# Patient Record
Sex: Female | Born: 1955 | Race: White | Hispanic: No | Smoking: Never smoker
Health system: Southern US, Community
[De-identification: ages and names within clinical notes are randomized; demographics above are authoritative.]

## PROBLEM LIST (undated history)

## (undated) DIAGNOSIS — R011 Cardiac murmur, unspecified: Secondary | ICD-10-CM

## (undated) DIAGNOSIS — E559 Vitamin D deficiency, unspecified: Secondary | ICD-10-CM

## (undated) DIAGNOSIS — E785 Hyperlipidemia, unspecified: Secondary | ICD-10-CM

## (undated) DIAGNOSIS — F32A Depression, unspecified: Secondary | ICD-10-CM

## (undated) DIAGNOSIS — R519 Headache, unspecified: Secondary | ICD-10-CM

## (undated) DIAGNOSIS — K589 Irritable bowel syndrome without diarrhea: Secondary | ICD-10-CM

## (undated) DIAGNOSIS — R05 Cough: Secondary | ICD-10-CM

## (undated) DIAGNOSIS — E039 Hypothyroidism, unspecified: Secondary | ICD-10-CM

## (undated) DIAGNOSIS — R51 Headache: Secondary | ICD-10-CM

## (undated) DIAGNOSIS — E079 Disorder of thyroid, unspecified: Secondary | ICD-10-CM

## (undated) DIAGNOSIS — Z8489 Family history of other specified conditions: Secondary | ICD-10-CM

## (undated) DIAGNOSIS — M810 Age-related osteoporosis without current pathological fracture: Secondary | ICD-10-CM

## (undated) DIAGNOSIS — F988 Other specified behavioral and emotional disorders with onset usually occurring in childhood and adolescence: Secondary | ICD-10-CM

## (undated) DIAGNOSIS — M1990 Unspecified osteoarthritis, unspecified site: Secondary | ICD-10-CM

## (undated) DIAGNOSIS — K219 Gastro-esophageal reflux disease without esophagitis: Secondary | ICD-10-CM

## (undated) DIAGNOSIS — M797 Fibromyalgia: Secondary | ICD-10-CM

## (undated) DIAGNOSIS — F419 Anxiety disorder, unspecified: Secondary | ICD-10-CM

## (undated) DIAGNOSIS — R053 Chronic cough: Secondary | ICD-10-CM

## (undated) DIAGNOSIS — F329 Major depressive disorder, single episode, unspecified: Secondary | ICD-10-CM

## (undated) HISTORY — DX: Disorder of thyroid, unspecified: E07.9

## (undated) HISTORY — DX: Major depressive disorder, single episode, unspecified: F32.9

## (undated) HISTORY — PX: FOOT SURGERY: SHX648

## (undated) HISTORY — PX: FACELIFT: SHX1566

## (undated) HISTORY — DX: Hyperlipidemia, unspecified: E78.5

## (undated) HISTORY — PX: OTHER SURGICAL HISTORY: SHX169

## (undated) HISTORY — DX: Vitamin D deficiency, unspecified: E55.9

## (undated) HISTORY — DX: Depression, unspecified: F32.A

## (undated) HISTORY — DX: Anxiety disorder, unspecified: F41.9

## (undated) HISTORY — DX: Fibromyalgia: M79.7

## (undated) HISTORY — DX: Other specified behavioral and emotional disorders with onset usually occurring in childhood and adolescence: F98.8

---

## 1999-10-21 ENCOUNTER — Encounter: Payer: Self-pay | Admitting: Internal Medicine

## 1999-10-21 ENCOUNTER — Encounter: Admission: RE | Admit: 1999-10-21 | Discharge: 1999-10-21 | Payer: Self-pay | Admitting: Internal Medicine

## 2001-02-02 ENCOUNTER — Encounter: Admission: RE | Admit: 2001-02-02 | Discharge: 2001-02-02 | Payer: Self-pay | Admitting: Internal Medicine

## 2001-02-02 ENCOUNTER — Encounter: Payer: Self-pay | Admitting: Internal Medicine

## 2002-03-01 ENCOUNTER — Encounter: Admission: RE | Admit: 2002-03-01 | Discharge: 2002-03-01 | Payer: Self-pay | Admitting: Internal Medicine

## 2002-03-01 ENCOUNTER — Encounter: Payer: Self-pay | Admitting: Internal Medicine

## 2002-03-07 ENCOUNTER — Encounter: Admission: RE | Admit: 2002-03-07 | Discharge: 2002-03-07 | Payer: Self-pay | Admitting: Obstetrics and Gynecology

## 2002-03-07 ENCOUNTER — Encounter: Payer: Self-pay | Admitting: Obstetrics and Gynecology

## 2002-03-14 ENCOUNTER — Other Ambulatory Visit: Admission: RE | Admit: 2002-03-14 | Discharge: 2002-03-14 | Payer: Self-pay | Admitting: Obstetrics and Gynecology

## 2003-06-25 ENCOUNTER — Ambulatory Visit (HOSPITAL_BASED_OUTPATIENT_CLINIC_OR_DEPARTMENT_OTHER): Admission: RE | Admit: 2003-06-25 | Discharge: 2003-06-26 | Payer: Self-pay | Admitting: Specialist

## 2003-06-25 ENCOUNTER — Ambulatory Visit (HOSPITAL_BASED_OUTPATIENT_CLINIC_OR_DEPARTMENT_OTHER): Admission: RE | Admit: 2003-06-25 | Discharge: 2003-06-25 | Payer: Self-pay | Admitting: Podiatry

## 2003-09-05 ENCOUNTER — Other Ambulatory Visit: Admission: RE | Admit: 2003-09-05 | Discharge: 2003-09-05 | Payer: Self-pay | Admitting: Obstetrics and Gynecology

## 2003-09-13 ENCOUNTER — Encounter: Admission: RE | Admit: 2003-09-13 | Discharge: 2003-09-13 | Payer: Self-pay | Admitting: Obstetrics and Gynecology

## 2003-10-17 ENCOUNTER — Inpatient Hospital Stay (HOSPITAL_COMMUNITY): Admission: RE | Admit: 2003-10-17 | Discharge: 2003-10-19 | Payer: Self-pay | Admitting: Psychiatry

## 2003-10-23 ENCOUNTER — Other Ambulatory Visit (HOSPITAL_COMMUNITY): Admission: RE | Admit: 2003-10-23 | Discharge: 2003-10-24 | Payer: Self-pay | Admitting: Psychiatry

## 2004-10-29 ENCOUNTER — Encounter: Admission: RE | Admit: 2004-10-29 | Discharge: 2004-10-29 | Payer: Self-pay | Admitting: Obstetrics and Gynecology

## 2006-11-09 ENCOUNTER — Encounter: Admission: RE | Admit: 2006-11-09 | Discharge: 2006-11-09 | Payer: Self-pay | Admitting: Obstetrics and Gynecology

## 2007-10-13 ENCOUNTER — Encounter: Admission: RE | Admit: 2007-10-13 | Discharge: 2007-10-13 | Payer: Self-pay | Admitting: Family Medicine

## 2008-01-30 ENCOUNTER — Encounter: Admission: RE | Admit: 2008-01-30 | Discharge: 2008-01-30 | Payer: Self-pay | Admitting: Obstetrics and Gynecology

## 2009-02-22 ENCOUNTER — Encounter: Admission: RE | Admit: 2009-02-22 | Discharge: 2009-02-22 | Payer: Self-pay | Admitting: Obstetrics and Gynecology

## 2010-04-09 ENCOUNTER — Encounter: Admission: RE | Admit: 2010-04-09 | Discharge: 2010-04-09 | Payer: Self-pay | Admitting: Obstetrics and Gynecology

## 2010-06-22 ENCOUNTER — Encounter: Payer: Self-pay | Admitting: Family Medicine

## 2010-06-22 ENCOUNTER — Encounter: Payer: Self-pay | Admitting: Obstetrics and Gynecology

## 2010-10-17 NOTE — Discharge Summary (Signed)
Tracey Rowe, Tracey Rowe                         ACCOUNT NO.:  [id number]   MEDICAL RECORD NO.:  [id number]                   PATIENT TYPE:  IPS   LOCATION:  0502                                 FACILITY:  [facility]   PHYSICIAN:  Geoffery Lyons, M.D.                   DATE OF BIRTH:  March 14, 1956   DATE OF ADMISSION:  10/17/2003  DATE OF DISCHARGE:  10/19/2003                                 DISCHARGE SUMMARY   CHIEF COMPLAINT AND PRESENT ILLNESS:  This was the first admission to [facility] for this 55 year old married white female.  History  of depression, suicidal threats, claimed that the husband was having an  affair but that is apparently over and she was trying to deal with it but  cannot resolve it.  They are both in therapy.  She threatened to cut herself  and the husband told the family.  She ran away.  Family wanted her to seek  help.   PAST PSYCHIATRIC HISTORY:  First time at [facility].  Had seen Dr.  Evelene Croon and Joette Catching. Salinger, Ph.D.   ALCOHOL/DRUG HISTORY:  Denies the use of alcohol.  Admits to occasional use  of marijuana.   MEDICAL HISTORY:  Fibromyalgia.   MEDICATIONS:  Neurontin 800 mg, 2 four times a day, Prozac 40 mg per day,  Soma 350 mg, 1-2 four times a day as needed, Xanax 0.5 mg three times a day,  Wellbutrin XL 300 mg daily.   PHYSICAL EXAMINATION:  Performed and failed to show any acute findings.   LABORATORY DATA:  CBC within normal limits.  Blood chemistry within normal  limits.  Thyroid profile within normal limits.  Urine pregnancy negative.  Drug screen positive for amphetamines and benzodiazepines.   MENTAL STATUS EXAM:  Alert, middle-aged female.  Cooperative.  Good eye  contact.  Speech clear, goal-oriented, articulate.  Mood depressed.  Affect  constricted.  Thought processes coherent and relevant.  No delusional ideas.  No hallucinations.  Cognition well-preserved.   ADMISSION DIAGNOSES:   AXIS I:  Major depression,  single episode.   AXIS II:  No diagnosis.   AXIS III:  Fibromyalgia.   AXIS IV:  Moderate.   AXIS V:  Global Assessment of Functioning upon admission 35; highest Global  Assessment of Functioning in the last year 65.   HOSPITAL COURSE:  She was admitted and started intensive individual and  group psychotherapy.  She was maintained on Neurontin 800 mg 2 four times a  day, Wellbutrin XL 300 mg per day, amphetamine 20 mg in the morning, Xanax  0.5 mg three times a day as needed, Soma 350 mg, 1-2 four times a day as  needed, Prozac 40 mg per day.  Neurontin was changed to 800 mg four times a  day.  She was given some Ultram 50 mg for pain.  We requested family session  with the  husband.  She endorsed increased anxiety and depression, stemming  from having found out that the husband had the affair.  Also dealing with  the first flare-ups of fibromyalgia.  She was denying any suicidal ideation.  She was wanting to go back and see Dr. Evelene Croon and maybe get involved in  intensive outpatient program.  Family session with the husband.  They were  able to problem-solve.  She was upbeat.  Denied any suicidal or homicidal  ideation.  The feeling was that she was back to baseline and that she was  ready to be discharged.  They were both willing to pursue treatment.  She  was not suicidal and homicidal.  She was much improved.  Willing to pursue  further outpatient treatment.   DISCHARGE DIAGNOSES:   AXIS I:  Major depression.   AXIS II:  No diagnosis.   AXIS III:  Fibromyalgia.   AXIS IV:  Moderate.   AXIS V:  Global Assessment of Functioning upon discharge 55-60.   DISCHARGE MEDICATIONS:  1. Neurontin 400 mg, 2 four times a day.  2. Wellbutrin XL 300 mg daily.  3. Prozac 20 mg, 2 daily.  4. Dexedrine Spansules 10 mg, 2 in the morning.  5. Soma 350 mg, 1-2 four times a day as needed.  6. Xanax 0.5 mg three times a day as needed.   FOLLOW UP:  [facility] and Dr. Evelene Croon  and Dr. Everlene Other.                                               Geoffery Lyons, M.D.    IL/MEDQ  D:  11/14/2003  T:  11/14/2003  Job:  [id number]

## 2010-10-17 NOTE — Op Note (Signed)
Tracey Rowe, Tracey Rowe                         ACCOUNT NO.:  [id number]   MEDICAL RECORD NO.:  [id number]                   PATIENT TYPE:  AMB   LOCATION:  DSC                                  FACILITY:  [facility]   PHYSICIAN:  Earvin Hansen L. Shon Hough, M.D.           DATE OF BIRTH:  Jun 21, 1955   DATE OF PROCEDURE:  06/25/2003  DATE OF DISCHARGE:                                 OPERATIVE REPORT   INDICATIONS FOR PROCEDURE:  This is a patient who has increased abdominal  dermachalasis, diastasis recti with lipodystrophy involving her upper and  lower and lateral abdomen, inner thighs, submental area on her face, and jaw  areas.  She also has laxity of the right and left brow areas, the left side  is lower than the right with brow ptosis.  She also has weakness involving  her upper and lower lips.   PROCEDURE:  Abdominoplasty with repair of diastasis recti, liposuction of  the abdomen, liposuction of the inner thighs, liposuction of the full face  and submental areas, bilateral brow lifts, fatty dermal transplants to the  upper and lower lips.   SURGEON:  Yaakov Guthrie. Shon Hough, M.D.   ANESTHESIA:  General.   INDICATIONS FOR PROCEDURE:  Preoperatively, the patient was set up and drawn  for the W-plasty abdominoplasty as well as demarcation of all of the  lipodystrophy areas of the face, inner thighs, and abdomen.   DESCRIPTION OF PROCEDURE:  She then underwent general anesthesia intubated  orally.  Prep was done to the abdomen, face, neck, and thigh areas with  Betadine soap and solution and walled off with sterile towels and drapes so  as to make a sterile field.  Tumescence was injected into all of the sites,  also 0.5% Xylocaine with epinephrine was injected into the right and left  brow areas and forehead.  A W-plasty incision was entered after the patient  had undergone Foley catheter insertion.  Incision was carried down through  the skin and subcutaneous tissue, dissecting over the  areolas of the fascia.  Dissection was carried up to the umbilicus.  The umbilicus was released and  was on a pedicle.  Dissection was carried up to the upper quadrants as well  as the xiphoid process.  Increased diastasis recti was observed and was  repaired with a running locking suture of #1 Prolene from the xiphoid  process to the umbilicus and from the umbilicus to the suprapubic area.  Out  laterally in the upper abdomen, liposuction was fashioned using the [place]  catheter 28-3 and 4, removing large volumes of tissue.  Next, the patient  was placed in a jackknife position.  Excess skin was delivered and trimmed  appropriately.  A new hole was made for the umbilicus and then the wounds  were closed with 2-0 and 3-0 Vicryl x2 levels and then a running  subcuticular stitch of 3-0 Vicryl. All areas were closed likewise.  The  wounds were drained with large Hemovac drains which were placed in the  depths of the wound and brought out through the suprapubic area and secured  with 3-0 Prolene.  Next, liposuction was fashioned over the inner thighs as  well as submental part of the face, jaw areas, and lateral face through  submental incisions as well as right and left postauricular incisions.  Next, incision was made into the hairline, approximately 1.5 cm back into  the hairline and dissection was carried through the superficial plane over  the right and left forehead brow areas.  I was able to do a muscle sling  suspension on the left side to balance off with the right side which was  much higher.  We also did a more aggressive lift on the left side to measure  with the gradient difference of the height of each eyebrow.  The excess skin  and tissue was removed posteriorly.  Subcutaneous closure was done with 2-0  Monocryl x2 layers and then skin staples.  She tolerated this part of the  procedure very nicely.  Next, some of the skin and fat from the abdomen was  deepithelialized.  Markings  were made for outline of the patient's lips.  A  tunnel was made through the upper and lower lips through the orbicularis and  then a dermal fat graft was placed, thickened throughout the areas of the  right and left lips giving good fullness and augmentation.  The incisions  there were closed with 5-0 nylon.  All of the wounds were cleansed and  covered with sterile dressings.  Liposuction garment and abdominal support  for the lower body, facial support for the upper face, and brow areas and  forehead.  She withstood all of the procedures very well and was taken to  the recovery room in excellent condition.  Estimated blood loss was 250 mL.  Complications were none.                                               Yaakov Guthrie. Shon Hough, M.D.    Cathie Hoops  D:  06/25/2003  T:  06/25/2003  Job:  [id number]

## 2010-10-17 NOTE — Op Note (Signed)
NAMEALYRIA, KRACK                         ACCOUNT NO.:  [id number]   MEDICAL RECORD NO.:  [id number]                   PATIENT TYPE:  AMB   LOCATION:  DSC                                  FACILITY:  [facility]   PHYSICIAN:  Ysidro Evert. Regal, D.P.M.              DATE OF BIRTH:  1956-05-24   DATE OF PROCEDURE:  06/25/2003  DATE OF DISCHARGE:  06/26/2003                                 OPERATIVE REPORT   SURGEON:  Ysidro Evert. Regal, D.P.M.   PREOPERATIVE DIAGNOSIS:  Hallux valgus deformity, left.   POSTOPERATIVE DIAGNOSIS:  Hallux valgus deformity, left.   PROCEDURE:  Austin bunionectomy with 4-5 internal K-wire fixation, left.   INDICATION FOR SURGERY:  Chronic discomfort, inability to wear shoe gear  without difficulty, has tried wider shoes, tried different modalities  without relief of symptoms and is requesting surgery.   PROCEDURE:  The patient was brought to the OR and placed in supine position  on the OR table.  The patient was placed under a general anesthetic.  I then  attempted to use a local anesthetic in the foot.  Her foot and her entire  body had sterile prep applied.  The following procedure occurred:  Utilizing  an Esmarch, I exsanguinated the left foot.  I then inflated tourniquet to  250 mmHg.  The foot was prepped.  I then did a 6-cm linear incision centered  over the 1st metatarsal head medial to the extensor hallucis longus tendon.  The incision was deepened through subcutaneous tissue to the level of  capsular tissue, where an inverted L-shaped capsular incision was performed.  The capsular tissue was sharply dissected off of the underlying bone.  We  found a large hyperostosis of the medial aspect of the 1st metatarsal head,  left, and a dorsal spur on the left 1st metatarsal.  Utilizing sharp and  blunt dissection, I then entered the 1st intermetatarsal space and released  the adductor hallucis tendon.  I then cut off the medial and dorsal spurs  with a power  saw.  I created a V osteotomy in the 1st metatarsal and  transposed the 1st metatarsal head in a lateral and plantarflexed direction.  I fixated it with a 0.45 K-wire and I reduced all redundant medial bone.  I  then utilized a rasp and cleaned up all bone edges.  I utilized copious  amounts of sterile flush solution.  The capsule tissue was reapproximated  utilizing 3-0 Vicryl in a simple interrupted fashion, subcutaneous tissue  reapproximated utilizing 4-0 Vicryl in an continuous running fashion, skin  margins reapproximated and maintained using 5-0 Monocryl in a subcuticular  fashion.  Surgical site was infiltrated with 1 mL of dexamethasone and a  sterile dressing was applied to the left foot.  The left ankle tourniquet  was deflated and capillary refill was noted to be immediate to all digits on  the left foot.  The patient  was discharged by department of anesthesia and  Dr. Earvin Hansen L. [place]es[place].                                               Ysidro Evert. Regal, D.P.M.    NSR/MEDQ  D:  07/09/2003  T:  07/10/2003  Job:  [id number]

## 2010-11-26 ENCOUNTER — Encounter: Payer: Private Health Insurance - Indemnity | Attending: Neurosurgery | Admitting: Neurosurgery

## 2010-11-26 DIAGNOSIS — IMO0001 Reserved for inherently not codable concepts without codable children: Secondary | ICD-10-CM

## 2010-11-26 DIAGNOSIS — F3289 Other specified depressive episodes: Secondary | ICD-10-CM | POA: Insufficient documentation

## 2010-11-26 DIAGNOSIS — M25569 Pain in unspecified knee: Secondary | ICD-10-CM

## 2010-11-26 DIAGNOSIS — F329 Major depressive disorder, single episode, unspecified: Secondary | ICD-10-CM | POA: Insufficient documentation

## 2010-11-26 DIAGNOSIS — M5430 Sciatica, unspecified side: Secondary | ICD-10-CM

## 2010-11-26 DIAGNOSIS — G8929 Other chronic pain: Secondary | ICD-10-CM | POA: Insufficient documentation

## 2010-11-27 NOTE — Progress Notes (Signed)
HISTORY OF PRESENT ILLNESS:  This patient was referred to [place] through Dr. Fatima Sanger office.  She states she has fibromyalgia that results in joint pain and muscular pain all over.  She states that Soma that Dr. Corliss Skains gives her does help, but she was only prescribed once a day, and she needs to increase that.  The patient has no significant surgical history.  She does not complain really of any specific joint pain to some generalized pain that fluctuates from time to time.  She rates her pain today as 4.  It is a sharp burning pain intermittently to and aching pain.  The pain is worse in the morning and the evening sleep pattern is poor to fair.  All activities aggravate her situation.  Rest, heat, therapy, medications and injections tend to help.  She does do pull exercises as well.  Mobility.  She walks independently.  She does not have to limp.  She climbs steps and drives.  She is not employed.  REVIEW OF SYSTEMS:  Notable for those difficulties above as well as some diarrhea, abdominal pain, poor appetite.  She does have confusion, depression, and anxiety.  She contributes to PTSD which is situational. She has a little bit of bowel and bladder control problems with some weakness.  She has seen Dr. Alveda Reasons an orthopedist in the past.  She has a psychologist.  She did not name.  She has a primary care doctor and rheumatologist she did not name.  PAST MEDICAL HISTORY:  Significant for difficulty with her lungs, thyroid, intestinal problems, and arthritis.  SOCIAL HISTORY:  She is divorced.  She lives with daughter.  She is in college right now for interior design.  She has minimal alcoholic beverage intake.  She does not smoke.  FAMILY HISTORY:  Significant for heart disease, diabetes, hypertension, and alcohol abuse.  PSYCHIATRIC PROBLEMS:  Drug abuse and disability.  PHYSICAL EXAMINATION:  VITAL SIGNS:  Blood pressure is 120/79, pulse 76, respirations 18, O2 sats 100% on  room air.  She has 5/5 motor strength in the upper and lower extremities including deltoid, biceps, triceps, intrinsics, grip, iliopsoas, quadriceps, dorsiflexors, EHL, and plantar flexors.  Her sensation is intact to pinprick throughout the upper and lower extremities.  She can flex to about 90 degrees and extend about 10 degrees without any pain.  She has rotational limitations from side to side but no pain, reflexes are minimal at the triceps, absent at the biceps, and brachioradialis.  She is 2 at the quadriceps, minimal at the gastrocs.  Toes are downgoing. She has a normal gait and stance.  IMPRESSION: 1. Fibromyalgia. 2. Chronic pain. 3. Depression.  PLAN:  We are going to give her a prescription today for Soma 350 mg one p.o. b.i.d. and one p.o. nightly, 90 with three refills.  No other prescriptions were given.  She did not request any narcotics.  She did complete her UDS today.  We will follow her up in the clinic in 1 month. Her questions were encouraged and answered.     Keron Koffman L. Blima Dessert Electronically Signed    RLW/MedQ D:  11/26/2010 10:41:34  T:  11/27/2010 00:59:44  Job #:  [id number]

## 2010-12-29 ENCOUNTER — Ambulatory Visit (HOSPITAL_BASED_OUTPATIENT_CLINIC_OR_DEPARTMENT_OTHER): Payer: Private Health Insurance - Indemnity | Admitting: Physical Medicine & Rehabilitation

## 2010-12-29 ENCOUNTER — Encounter: Payer: Private Health Insurance - Indemnity | Attending: Neurosurgery

## 2010-12-29 DIAGNOSIS — M545 Low back pain, unspecified: Secondary | ICD-10-CM

## 2010-12-29 DIAGNOSIS — F329 Major depressive disorder, single episode, unspecified: Secondary | ICD-10-CM | POA: Insufficient documentation

## 2010-12-29 DIAGNOSIS — G8929 Other chronic pain: Secondary | ICD-10-CM | POA: Insufficient documentation

## 2010-12-29 DIAGNOSIS — IMO0001 Reserved for inherently not codable concepts without codable children: Secondary | ICD-10-CM

## 2010-12-29 DIAGNOSIS — F3289 Other specified depressive episodes: Secondary | ICD-10-CM | POA: Insufficient documentation

## 2010-12-29 NOTE — Assessment & Plan Note (Signed)
REASON FOR VISIT:  Pain all over body.  HISTORY AND PHYSICAL:  A 55 year old female with fibromyalgia joint pain, pain all over.  She gets relief from [facility], has tried cyclobenzaprine and has tried Robaxin in the past without much relief. She has not tried tizanidine.  She is also on [place]wn NX which she just got a refill on 103-month supply.  We talked about this not being a good long-term solution.  She thinks that her pain level will improve once she gets less "stressed out."  She is trying to move.  She has some stress related to her children, one of whom is in college, one of whom just moved out of the house.  Her other meds include Synthroid, alprazolam, sertraline, metoprolol, topiramate, _______ and Talwin NX.  ALLERGIES:  MORPHINE causes itching.  She has some grass allergies.  FUNCTIONAL STATUS:  She is independent with her ADLs, but has some pain related to this.  She can manage light-to-medium weight.  She can walk up to one mile, but not more, sitting tolerance is 1 hour, standing tolerance is 10 minutes.  Sleeps with pain medicine on board.  She can do her homemaking including some vacuuming.  We discussed exercise program in the past.  She has been involved with aquatic therapy which she really did with the best.  She does some walking, still.  PHYSICAL EXAMINATION:  GENERAL:  Well-developed, well-nourished female in no acute stress.  Mood and affect are appropriate. MUSCULOSKELETAL:  Her back has no evidence of scoliosis.  She has mild tenderness-to-palpation in lumbar paraspinals.  No tenderness over the hips or buttock area and the fibromyalgia tender points.  No tenderness over the traps or the knees, but has positive tenderness over the elbows bilaterally.  Neck range of motion is good.  Lumbar spine range of motion is normal.  Straight leg raising test is negative.  Upper and lower extremity strength is normal.  Gait is normal.  IMPRESSION:  Fibromyalgia syndrome,  I think this is her primary pain problem.  PLAN: 1. Overall long-term goal is to reduce and eliminate narcotic     analgesics from regimen and also switch her off the [place].  She just     had a 77-month supply filled each of these medications. 2. In terms of exercise program, I have advised to get back into the     pool.  She does have a home exercise program from [facility]     about a year ago, but needs to do this on a regular basis. 3. Continue walking.  I will see her back in about 6 months and nurse     practitioner visit in 3 months.     Erick Colace, M.D. Electronically Signed    AEK/MedQ D:  12/29/2010 16:38:23  T:  12/29/2010 17:55:54  Job #:  [id number]

## 2011-03-20 ENCOUNTER — Encounter: Payer: Self-pay | Admitting: Neurosurgery

## 2011-06-12 ENCOUNTER — Ambulatory Visit (HOSPITAL_BASED_OUTPATIENT_CLINIC_OR_DEPARTMENT_OTHER): Payer: Self-pay | Admitting: Physical Medicine & Rehabilitation

## 2011-06-12 ENCOUNTER — Encounter: Payer: PRIVATE HEALTH INSURANCE | Attending: Physical Medicine & Rehabilitation

## 2011-06-12 DIAGNOSIS — IMO0001 Reserved for inherently not codable concepts without codable children: Secondary | ICD-10-CM

## 2011-06-12 DIAGNOSIS — M545 Low back pain, unspecified: Secondary | ICD-10-CM

## 2011-06-13 NOTE — Assessment & Plan Note (Signed)
A 56 year old female with fibromyalgia syndrome.  I saw her in July 2012.  She has been on Soma in the past, continues on it.  She has had a UDS back in July and not clear whether she reported taking the Carisoprodol, but really did not show up.  She did have evidence of Talwin.  She states that the Tresa Garter helps her with her sleep.  She takes Talwin as well.  In the interval time, she has seen orthopedist at [facility], and I believe Dr. Modesto Charon who performed some type of back injection after she had MRI showing a "bulging disk, I do not have any of these records."  She has seen Dr. Corliss Skains.  She was told by her school to get some type of disability restrictions based on the patient's diagnosis of fibromyalgia.  The patient indicates her pain is 6/10.  REVIEW OF SYSTEMS:  Bladder problems, bowel problems, tremor, tingling, trouble walking, confusion, depression and anxiety.  MEDICATIONS:  Her other medications include Synthroid, alprazolam, sertraline, metoprolol, topiramate.  She states she has tried Skelaxin and cyclobenzaprine which were not helpful or had side effects that she could not tolerate.  PHYSICAL EXAMINATION:  0/18 fibromyalgia points positive.  Back has no tenderness to palpation.  She has full range of motion.  Full strength in the upper and lower extremities.  Deep tendon reflexes are normal.  IMPRESSION: 1. Fibromyalgia syndrome appears to be functioning independently.  Her     last urine drug screen was negative for Soma.  I will repeat 1     today.  She reports taking it still.  If the Tresa Garter is negative once     again, I will not prescribe anymore. 2. In terms of her Talwin NX, I really have not prescribed this for     fibromyalgia in the past.  Her plan were used it for an acute disk     problem.  I advised her to follow up with physician who has been     prescribing it. 3. In terms of therapy and injections, she states that she really has     some  financial and time constrictions, cannot do any of these     things.  As I discussed with the patient, I really do not have that     much more to offer her.  Certainly, has been seeing Physiatry,     Rheumatology and Orthopedics and is now starting a pain study at     [facility]. 4. We also discussed that I do not fill disability papers for the     diagnosis of fibromyalgia syndrome.     Erick Colace, M.D. Electronically Signed    AEK/MedQ D:  06/12/2011 12:55:47  T:  06/13/2011 17:56:18  Job #:  [id number]  cc:   Dario Guardian, M.D. Fax: [phone]  Pollyann Savoy, M.D. Fax: [phone]

## 2011-12-11 ENCOUNTER — Ambulatory Visit: Payer: PRIVATE HEALTH INSURANCE | Admitting: Physical Medicine & Rehabilitation

## 2012-01-12 ENCOUNTER — Other Ambulatory Visit: Payer: Self-pay

## 2012-12-30 ENCOUNTER — Other Ambulatory Visit: Payer: Self-pay

## 2012-12-30 DIAGNOSIS — Z1231 Encounter for screening mammogram for malignant neoplasm of breast: Secondary | ICD-10-CM

## 2013-01-11 ENCOUNTER — Ambulatory Visit
Admission: RE | Admit: 2013-01-11 | Discharge: 2013-01-11 | Disposition: A | Discharge status: Home / Self Care | Payer: BC Managed Care – PPO | Source: Ambulatory Visit

## 2013-01-11 DIAGNOSIS — Z1231 Encounter for screening mammogram for malignant neoplasm of breast: Secondary | ICD-10-CM

## 2014-05-19 ENCOUNTER — Encounter: Payer: Self-pay | Admitting: *Deleted

## 2014-12-10 ENCOUNTER — Other Ambulatory Visit: Payer: Self-pay

## 2014-12-10 DIAGNOSIS — Z1231 Encounter for screening mammogram for malignant neoplasm of breast: Secondary | ICD-10-CM

## 2014-12-12 ENCOUNTER — Ambulatory Visit
Admission: RE | Admit: 2014-12-12 | Discharge: 2014-12-12 | Disposition: A | Discharge status: Home / Self Care | Payer: 59 | Source: Ambulatory Visit | Attending: Family Medicine | Admitting: Family Medicine

## 2014-12-12 ENCOUNTER — Other Ambulatory Visit: Payer: Self-pay | Admitting: Family Medicine

## 2014-12-12 ENCOUNTER — Ambulatory Visit
Admission: RE | Admit: 2014-12-12 | Discharge: 2014-12-12 | Disposition: A | Discharge status: Home / Self Care | Payer: 59 | Source: Ambulatory Visit

## 2014-12-12 DIAGNOSIS — Z1231 Encounter for screening mammogram for malignant neoplasm of breast: Secondary | ICD-10-CM

## 2014-12-12 DIAGNOSIS — M542 Cervicalgia: Secondary | ICD-10-CM

## 2016-04-01 ENCOUNTER — Other Ambulatory Visit: Payer: Self-pay | Admitting: Obstetrics & Gynecology

## 2016-04-01 DIAGNOSIS — Z1231 Encounter for screening mammogram for malignant neoplasm of breast: Secondary | ICD-10-CM

## 2016-05-05 ENCOUNTER — Ambulatory Visit: Payer: Self-pay

## 2016-05-12 ENCOUNTER — Ambulatory Visit
Admission: RE | Admit: 2016-05-12 | Discharge: 2016-05-12 | Disposition: A | Discharge status: Home / Self Care | Payer: Self-pay | Source: Ambulatory Visit | Attending: Obstetrics & Gynecology | Admitting: Obstetrics & Gynecology

## 2016-05-12 DIAGNOSIS — Z1231 Encounter for screening mammogram for malignant neoplasm of breast: Secondary | ICD-10-CM

## 2016-06-08 ENCOUNTER — Ambulatory Visit: Payer: Self-pay

## 2017-01-14 ENCOUNTER — Other Ambulatory Visit: Payer: Self-pay | Admitting: Orthopedic Surgery

## 2017-01-14 DIAGNOSIS — M546 Pain in thoracic spine: Secondary | ICD-10-CM

## 2017-01-24 ENCOUNTER — Ambulatory Visit
Admission: RE | Admit: 2017-01-24 | Discharge: 2017-01-24 | Disposition: A | Discharge status: Home / Self Care | Payer: BLUE CROSS/BLUE SHIELD | Source: Ambulatory Visit | Attending: Orthopedic Surgery | Admitting: Orthopedic Surgery

## 2017-01-24 DIAGNOSIS — M546 Pain in thoracic spine: Secondary | ICD-10-CM

## 2017-02-03 ENCOUNTER — Other Ambulatory Visit: Payer: Self-pay | Admitting: Orthopedic Surgery

## 2017-02-04 ENCOUNTER — Other Ambulatory Visit: Payer: Self-pay | Admitting: Orthopedic Surgery

## 2017-02-04 DIAGNOSIS — R5381 Other malaise: Secondary | ICD-10-CM

## 2017-02-10 ENCOUNTER — Ambulatory Visit (HOSPITAL_COMMUNITY)
Admission: RE | Admit: 2017-02-10 | Discharge: 2017-02-10 | Disposition: A | Discharge status: Home / Self Care | Payer: BLUE CROSS/BLUE SHIELD | Source: Ambulatory Visit | Attending: Orthopedic Surgery | Admitting: Orthopedic Surgery

## 2017-02-10 ENCOUNTER — Encounter (HOSPITAL_COMMUNITY): Payer: Self-pay

## 2017-02-10 ENCOUNTER — Other Ambulatory Visit (HOSPITAL_COMMUNITY): Payer: Self-pay | Admitting: *Deleted

## 2017-02-10 ENCOUNTER — Encounter (HOSPITAL_COMMUNITY)
Admission: RE | Admit: 2017-02-10 | Discharge: 2017-02-10 | Disposition: A | Discharge status: Home / Self Care | Payer: BLUE CROSS/BLUE SHIELD | Source: Ambulatory Visit | Attending: Orthopedic Surgery | Admitting: Orthopedic Surgery

## 2017-02-10 DIAGNOSIS — M4854XA Collapsed vertebra, not elsewhere classified, thoracic region, initial encounter for fracture: Secondary | ICD-10-CM | POA: Diagnosis not present

## 2017-02-10 DIAGNOSIS — Z01810 Encounter for preprocedural cardiovascular examination: Secondary | ICD-10-CM | POA: Insufficient documentation

## 2017-02-10 DIAGNOSIS — Z01818 Encounter for other preprocedural examination: Secondary | ICD-10-CM

## 2017-02-10 HISTORY — DX: Unspecified osteoarthritis, unspecified site: M19.90

## 2017-02-10 HISTORY — DX: Cardiac murmur, unspecified: R01.1

## 2017-02-10 HISTORY — DX: Hypothyroidism, unspecified: E03.9

## 2017-02-10 HISTORY — DX: Cough: R05

## 2017-02-10 HISTORY — DX: Headache, unspecified: R51.9

## 2017-02-10 HISTORY — DX: Gastro-esophageal reflux disease without esophagitis: K21.9

## 2017-02-10 HISTORY — DX: Chronic cough: R05.3

## 2017-02-10 HISTORY — DX: Headache: R51

## 2017-02-10 LAB — APTT: APTT: 25 s (ref 24–36)

## 2017-02-10 LAB — COMPREHENSIVE METABOLIC PANEL
ALT: 29 U/L (ref 14–54)
AST: 36 U/L (ref 15–41)
Albumin: 4.5 g/dL (ref 3.5–5.0)
Alkaline Phosphatase: 68 U/L (ref 38–126)
Anion gap: 9 (ref 5–15)
BUN: 6 mg/dL (ref 6–20)
CHLORIDE: 106 mmol/L (ref 101–111)
CO2: 24 mmol/L (ref 22–32)
CREATININE: 0.81 mg/dL (ref 0.44–1.00)
Calcium: 9.6 mg/dL (ref 8.9–10.3)
GFR calc Af Amer: >60 mL/min (ref >60)
GFR calc non Af Amer: >60 mL/min (ref >60)
Glucose, Bld: 84 mg/dL (ref 65–99)
Potassium: 3.2 mmol/L — ABNORMAL LOW (ref 3.5–5.1)
Sodium: 139 mmol/L (ref 135–145)
TOTAL PROTEIN: 7.8 g/dL (ref 6.5–8.1)
Total Bilirubin: 0.7 mg/dL (ref 0.3–1.2)

## 2017-02-10 LAB — CBC WITH DIFFERENTIAL/PLATELET
BASOS PCT: 1 %
Basophils Absolute: 0 10*3/uL (ref 0.0–0.1)
EOS PCT: 0 %
Eosinophils Absolute: 0 10*3/uL (ref 0.0–0.7)
HCT: 41.5 % (ref 36.0–46.0)
Hemoglobin: 13.6 g/dL (ref 12.0–15.0)
LYMPHS PCT: 21 %
Lymphs Abs: 1.2 10*3/uL (ref 0.7–4.0)
MCH: 30.5 pg (ref 26.0–34.0)
MCHC: 32.8 g/dL (ref 30.0–36.0)
MCV: 93 fL (ref 78.0–100.0)
Monocytes Absolute: 0.4 10*3/uL (ref 0.1–1.0)
Monocytes Relative: 8 %
NEUTROS ABS: 3.8 10*3/uL (ref 1.7–7.7)
Neutrophils Relative %: 70 %
PLATELETS: 238 10*3/uL (ref 150–400)
RBC: 4.46 MIL/uL (ref 3.87–5.11)
RDW: 13.1 % (ref 11.5–15.5)
WBC: 5.4 10*3/uL (ref 4.0–10.5)

## 2017-02-10 LAB — URINALYSIS, ROUTINE W REFLEX MICROSCOPIC
BILIRUBIN URINE: NEGATIVE
GLUCOSE, UA: NEGATIVE mg/dL
Hgb urine dipstick: NEGATIVE
KETONES UR: NEGATIVE mg/dL
Leukocytes, UA: NEGATIVE
Nitrite: NEGATIVE
PH: 6 (ref 5.0–8.0)
Protein, ur: NEGATIVE mg/dL
Specific Gravity, Urine: 1.01 (ref 1.005–1.030)

## 2017-02-10 LAB — PROTIME-INR
INR: 0.89
Prothrombin Time: 12 seconds (ref 11.4–15.2)

## 2017-02-10 NOTE — Pre-Procedure Instructions (Addendum)
Lunette StandsDeborah W Harroun  02/10/2017     Your procedure is scheduled on Wednesday, February 17, 2017    [facility]" Admitting Office at 10:00 AM.   Call this number if you have problems the morning of surgery: [phone]   Questions prior to day of surgery, please call [phone] between 8 & 4 PM.   Remember:  Do not eat food or drink liquids after midnight Tuesday, 02/16/17.  Take these medicines the morning of surgery with A SIP OF WATER: Tramadol (Ultram) - if needed  Stop NSAIDS (Ibuprofen, Aleve, etc), all Multivitamins and Herbal medications 7 days prior to surgery.(02/10/17) Do not use Aspirin products 7 days prior to surgery.   Do not wear jewelry, make-up or nail polish.  Do not wear lotions, powders, perfumes or deodorant.  Do not shave 48 hours prior to surgery.    Do not bring valuables to the hospital.  [facility] is not [facility] for any belongings or valuables.  Contacts, dentures or bridgework may not be worn into surgery.  Leave your suitcase in the car.  After surgery it may be brought to your room.  For patients admitted to the hospital, discharge time will be determined by your treatment team.  Patients discharged the day of surgery will not be allowed to drive home.   Special instructions:  [facility] - Preparing for Surgery  Before surgery, you can play an important role.  Because skin is not sterile, your skin needs to be as free of germs as possible.  You can reduce the number of germs on you skin by washing with CHG (chlorahexidine gluconate) soap before surgery.  CHG is an antiseptic cleaner which kills germs and bonds with the skin to continue killing germs even after washing.  Please DO NOT use if you have an allergy to CHG or antibacterial soaps.  If your skin becomes reddened/irritated stop using the CHG and inform your nurse when you arrive at Short Stay.  Do not shave (including legs and underarms) for at least 48  hours prior to the first CHG shower.  You may shave your face.  Please follow these instructions carefully:   1.  Shower with CHG Soap the night before surgery and the                    morning of Surgery.  2.  If you choose to wash your hair, wash your hair first as usual with your       normal shampoo.  3.  After you shampoo, rinse your hair and body thoroughly to remove the shampoo.  4.  Use CHG as you would any other liquid soap.  You can apply chg directly       to the skin and wash gently with scrungie or a clean washcloth.  5.  Apply the CHG Soap to your body ONLY FROM THE NECK DOWN.        Do not use on open wounds or open sores.  Avoid contact with your eyes, ears, mouth and genitals (private parts).  Wash genitals (private parts) with your normal soap.  6.  Wash thoroughly, paying special attention to the area where your surgery        will be performed.  7.  Thoroughly rinse your body with warm water from the neck down.  8.  DO NOT shower/wash with your normal soap after using and rinsing off  the CHG Soap.  9.  Pat yourself dry with a clean towel.            10.  Wear clean pajamas.            11.  Place clean sheets on your bed the night of your first shower and do not        sleep with pets.  Day of Surgery  Do not apply any lotions/deodorants the morning of surgery.  Please wear clean clothes to the hospital.   Please read over the fact sheets that you were given.

## 2017-02-10 NOTE — Pre-Procedure Instructions (Signed)
Tracey StandsDeborah W Rowe  02/10/2017     Your procedure is scheduled on Wednesday, February 17, 2017 at 1:00 PM.   [facility] "A" Admitting Office at 10:00 AM.   Call this number if you have problems the morning of surgery: [phone]   Questions prior to day of surgery, please call [phone] between 8 & 4 PM.   Remember:  Do not eat food or drink liquids after midnight Tuesday, 02/16/17.  Take these medicines the morning of surgery with A SIP OF WATER: Tramadol (Ultram) - if needed  Stop NSAIDS (Ibuprofen, Aleve, etc), Multivitamins and Herbal medications 5 days prior to surgery. Do not use Aspirin products 5 days prior to surgery.   Do not wear jewelry, make-up or nail polish.  Do not wear lotions, powders, perfumes or deodorant.  Do not shave 48 hours prior to surgery.    Do not bring valuables to the hospital.  [facility] is not responsible for any belongings or valuables.  Contacts, dentures or bridgework may not be worn into surgery.  Leave your suitcase in the car.  After surgery it may be brought to your room.  For patients admitted to the hospital, discharge time will be determined by your treatment team.  Patients discharged the day of surgery will not be allowed to drive home.   Special instructions:  [place] - Preparing for Surgery  Before surgery, you can play an important role.  Because skin is not sterile, your skin needs to be as free of germs as possible.  You can reduce the number of germs on you skin by washing with CHG (chlorahexidine gluconate) soap before surgery.  CHG is an antiseptic cleaner which kills germs and bonds with the skin to continue killing germs even after washing.  Please DO NOT use if you have an allergy to CHG or antibacterial soaps.  If your skin becomes reddened/irritated stop using the CHG and inform your nurse when you arrive at Short Stay.  Do not shave (including legs and underarms) for at least 48  hours prior to the first CHG shower.  You may shave your face.  Please follow these instructions carefully:   1.  Shower with CHG Soap the night before surgery and the                    morning of Surgery.  2.  If you choose to wash your hair, wash your hair first as usual with your       normal shampoo.  3.  After you shampoo, rinse your hair and body thoroughly to remove the shampoo.  4.  Use CHG as you would any other liquid soap.  You can apply chg directly       to the skin and wash gently with scrungie or a clean washcloth.  5.  Apply the CHG Soap to your body ONLY FROM THE NECK DOWN.        Do not use on open wounds or open sores.  Avoid contact with your eyes, ears, mouth and genitals (private parts).  Wash genitals (private parts) with your normal soap.  6.  Wash thoroughly, paying special attention to the area where your surgery        will be performed.  7.  Thoroughly rinse your body with warm water from the neck down.  8.  DO NOT shower/wash with your normal soap after using and rinsing off  the CHG Soap.  9.  Pat yourself dry with a clean towel.            10.  Wear clean pajamas.            11.  Place clean sheets on your bed the night of your first shower and do not        sleep with pets.  Day of Surgery  Do not apply any lotions/deodorants the morning of surgery.  Please wear clean clothes to the hospital.   Please read over the fact sheets that you were given.

## 2017-02-11 ENCOUNTER — Encounter (HOSPITAL_COMMUNITY): Payer: Self-pay | Admitting: Emergency Medicine

## 2017-02-11 ENCOUNTER — Other Ambulatory Visit: Payer: Self-pay | Admitting: Orthopedic Surgery

## 2017-02-11 DIAGNOSIS — M8580 Other specified disorders of bone density and structure, unspecified site: Secondary | ICD-10-CM

## 2017-02-17 ENCOUNTER — Encounter (HOSPITAL_COMMUNITY): Admission: RE | Payer: Self-pay | Source: Ambulatory Visit

## 2017-02-17 ENCOUNTER — Ambulatory Visit (HOSPITAL_COMMUNITY)
Admission: RE | Admit: 2017-02-17 | Payer: BLUE CROSS/BLUE SHIELD | Source: Ambulatory Visit | Admitting: Orthopedic Surgery

## 2017-02-17 SURGERY — KYPHOPLASTY
Anesthesia: General

## 2017-03-03 ENCOUNTER — Other Ambulatory Visit: Payer: Self-pay | Admitting: Obstetrics & Gynecology

## 2017-03-03 ENCOUNTER — Ambulatory Visit
Admission: RE | Admit: 2017-03-03 | Discharge: 2017-03-03 | Disposition: A | Discharge status: Home / Self Care | Payer: BLUE CROSS/BLUE SHIELD | Source: Ambulatory Visit | Attending: Orthopedic Surgery | Admitting: Orthopedic Surgery

## 2017-03-03 DIAGNOSIS — Z1231 Encounter for screening mammogram for malignant neoplasm of breast: Secondary | ICD-10-CM

## 2017-03-03 DIAGNOSIS — M8580 Other specified disorders of bone density and structure, unspecified site: Secondary | ICD-10-CM

## 2017-03-25 ENCOUNTER — Other Ambulatory Visit (INDEPENDENT_AMBULATORY_CARE_PROVIDER_SITE_OTHER): Payer: Self-pay | Admitting: Otolaryngology

## 2017-03-25 DIAGNOSIS — R51 Headache: Secondary | ICD-10-CM

## 2017-03-25 DIAGNOSIS — J329 Chronic sinusitis, unspecified: Secondary | ICD-10-CM

## 2017-03-25 DIAGNOSIS — R519 Headache, unspecified: Secondary | ICD-10-CM

## 2017-04-06 ENCOUNTER — Ambulatory Visit
Admission: RE | Admit: 2017-04-06 | Discharge: 2017-04-06 | Disposition: A | Discharge status: Home / Self Care | Payer: BLUE CROSS/BLUE SHIELD | Source: Ambulatory Visit | Attending: Otolaryngology | Admitting: Otolaryngology

## 2017-04-06 DIAGNOSIS — R519 Headache, unspecified: Secondary | ICD-10-CM

## 2017-04-06 DIAGNOSIS — J329 Chronic sinusitis, unspecified: Secondary | ICD-10-CM

## 2017-04-06 DIAGNOSIS — R51 Headache: Secondary | ICD-10-CM

## 2017-04-12 ENCOUNTER — Other Ambulatory Visit: Payer: Self-pay | Admitting: Orthopedic Surgery

## 2017-04-13 ENCOUNTER — Encounter (HOSPITAL_COMMUNITY): Payer: Self-pay | Admitting: *Deleted

## 2017-04-13 NOTE — Progress Notes (Signed)
Anesthesia Chart Review:  Pt is a same day work up.  Pt is a  [age] year old female scheduled for T7, T8 kyphoplasty on 04/14/2017 with Tracey Rowe, M.D.  Surgery was originally scheduled for September but was reportedly cancelled for insurance reasons.   - PCP is Tracey Brunetteandace Smith, MD. Last office visit 04/13/17.  Note indicates Tracey Rowe is aware of upcoming surgery.  Chest pain is not documented in history or as a complaint/sx.     - Pt reported to RN during pre-op phone call that she has had chest pain since 2009.  She reports she was evaluated by a cardiologist in 2009 and told symptoms were not cardiac but instead related to stress.    - Note from [facility] in media tab from 11/2010 indicates pt was seeing Tracey SchultzMark Skains, MD with cardiology at that time for hyperlipidemia.  Chest pain was not documented in her history in that note.  I will attempt to get [facility] by Dr. Anne Rowe.    PMH includes:  Heart murmur (unspecified), hyperlipidemia ADD, hypothyroidism, GERD. Never smoker.  Medications include: Levothyroxine, Prilosec  Labs will be obtained day of surgery  CXR 02/10/17:  - No acute cardiopulmonary abnormalities. - Superior endplate compression fracture T8.  EKG 02/10/17: NSR. RSR' or QR pattern in V1 suggests right ventricular conduction delay  Pt will need further evaluation by assigned anesthesiologist day of surgery.   Tracey Mastngela Kabbe, FNP-BC [facility]/Anesthesiology Phone: [phone] 04/13/2017 4:53 PM

## 2017-04-13 NOTE — Progress Notes (Signed)
Patient reports having chronic chest pain. She reports most recent episode 1- 2 weeks ago, sharp in nature, radiates into left chest, and occurs at random times. Reports cardiology workup in 2009 and that this has since been attributed to stress. PCP Candace Katrinka BlazingSmith with [facility] they will send office note as soon as it available.

## 2017-04-14 ENCOUNTER — Encounter (HOSPITAL_COMMUNITY)
Admission: RE | Disposition: A | Discharge status: Home / Self Care | Payer: Self-pay | Source: Ambulatory Visit | Attending: Orthopedic Surgery

## 2017-04-14 ENCOUNTER — Encounter (HOSPITAL_COMMUNITY): Payer: Self-pay | Admitting: Certified Registered Nurse Anesthetist

## 2017-04-14 ENCOUNTER — Ambulatory Visit (HOSPITAL_COMMUNITY): Payer: BLUE CROSS/BLUE SHIELD | Admitting: Emergency Medicine

## 2017-04-14 ENCOUNTER — Ambulatory Visit (HOSPITAL_COMMUNITY)
Admission: RE | Admit: 2017-04-14 | Discharge: 2017-04-14 | Disposition: A | Discharge status: Home / Self Care | Payer: BLUE CROSS/BLUE SHIELD | Source: Ambulatory Visit | Attending: Orthopedic Surgery | Admitting: Orthopedic Surgery

## 2017-04-14 ENCOUNTER — Ambulatory Visit (HOSPITAL_COMMUNITY): Payer: BLUE CROSS/BLUE SHIELD

## 2017-04-14 ENCOUNTER — Other Ambulatory Visit: Payer: Self-pay

## 2017-04-14 DIAGNOSIS — E559 Vitamin D deficiency, unspecified: Secondary | ICD-10-CM | POA: Diagnosis not present

## 2017-04-14 DIAGNOSIS — F329 Major depressive disorder, single episode, unspecified: Secondary | ICD-10-CM | POA: Insufficient documentation

## 2017-04-14 DIAGNOSIS — F419 Anxiety disorder, unspecified: Secondary | ICD-10-CM | POA: Diagnosis not present

## 2017-04-14 DIAGNOSIS — Z7951 Long term (current) use of inhaled steroids: Secondary | ICD-10-CM | POA: Insufficient documentation

## 2017-04-14 DIAGNOSIS — K219 Gastro-esophageal reflux disease without esophagitis: Secondary | ICD-10-CM | POA: Diagnosis not present

## 2017-04-14 DIAGNOSIS — Z79899 Other long term (current) drug therapy: Secondary | ICD-10-CM | POA: Insufficient documentation

## 2017-04-14 DIAGNOSIS — E039 Hypothyroidism, unspecified: Secondary | ICD-10-CM | POA: Diagnosis not present

## 2017-04-14 DIAGNOSIS — E785 Hyperlipidemia, unspecified: Secondary | ICD-10-CM | POA: Insufficient documentation

## 2017-04-14 DIAGNOSIS — Z885 Allergy status to narcotic agent status: Secondary | ICD-10-CM | POA: Insufficient documentation

## 2017-04-14 DIAGNOSIS — M4854XA Collapsed vertebra, not elsewhere classified, thoracic region, initial encounter for fracture: Secondary | ICD-10-CM | POA: Insufficient documentation

## 2017-04-14 DIAGNOSIS — S22000G Wedge compression fracture of unspecified thoracic vertebra, subsequent encounter for fracture with delayed healing: Secondary | ICD-10-CM

## 2017-04-14 DIAGNOSIS — M1990 Unspecified osteoarthritis, unspecified site: Secondary | ICD-10-CM | POA: Diagnosis not present

## 2017-04-14 DIAGNOSIS — Z419 Encounter for procedure for purposes other than remedying health state, unspecified: Secondary | ICD-10-CM

## 2017-04-14 HISTORY — DX: Family history of other specified conditions: Z84.89

## 2017-04-14 HISTORY — DX: Irritable bowel syndrome, unspecified: K58.9

## 2017-04-14 HISTORY — DX: Age-related osteoporosis without current pathological fracture: M81.0

## 2017-04-14 HISTORY — PX: KYPHOPLASTY: SHX5884

## 2017-04-14 LAB — CBC WITH DIFFERENTIAL/PLATELET
Basophils Absolute: 0.1 10*3/uL (ref 0.0–0.1)
Basophils Relative: 1 %
EOS ABS: 0 10*3/uL (ref 0.0–0.7)
Eosinophils Relative: 0 %
HEMATOCRIT: 42.4 % (ref 36.0–46.0)
HEMOGLOBIN: 14.3 g/dL (ref 12.0–15.0)
LYMPHS ABS: 1 10*3/uL (ref 0.7–4.0)
LYMPHS PCT: 18 %
MCH: 31.5 pg (ref 26.0–34.0)
MCHC: 33.7 g/dL (ref 30.0–36.0)
MCV: 93.4 fL (ref 78.0–100.0)
MONOS PCT: 10 %
Monocytes Absolute: 0.6 10*3/uL (ref 0.1–1.0)
NEUTROS ABS: 4.1 10*3/uL (ref 1.7–7.7)
NEUTROS PCT: 71 %
Platelets: 225 10*3/uL (ref 150–400)
RBC: 4.54 MIL/uL (ref 3.87–5.11)
RDW: 13.3 % (ref 11.5–15.5)
WBC: 5.7 10*3/uL (ref 4.0–10.5)

## 2017-04-14 LAB — URINALYSIS, ROUTINE W REFLEX MICROSCOPIC
Bilirubin Urine: NEGATIVE
Glucose, UA: NEGATIVE mg/dL
Hgb urine dipstick: NEGATIVE
KETONES UR: 5 mg/dL — AB
LEUKOCYTES UA: NEGATIVE
NITRITE: NEGATIVE
PH: 5 (ref 5.0–8.0)
PROTEIN: NEGATIVE mg/dL
Specific Gravity, Urine: 1.018 (ref 1.005–1.030)

## 2017-04-14 LAB — APTT: aPTT: 27 seconds (ref 24–36)

## 2017-04-14 LAB — PROTIME-INR
INR: 0.91
Prothrombin Time: 12.2 seconds (ref 11.4–15.2)

## 2017-04-14 SURGERY — KYPHOPLASTY
Anesthesia: General

## 2017-04-14 MED ORDER — ROCURONIUM BROMIDE 10 MG/ML (PF) SYRINGE
PREFILLED_SYRINGE | INTRAVENOUS | Status: AC
  Filled 2017-04-14: qty 5

## 2017-04-14 MED ORDER — LIDOCAINE 2% (20 MG/ML) 5 ML SYRINGE
INTRAMUSCULAR | Status: AC
  Filled 2017-04-14: qty 5

## 2017-04-14 MED ORDER — OXYCODONE-ACETAMINOPHEN 5-325 MG PO TABS
ORAL_TABLET | ORAL | Status: AC
  Administered 2017-04-14: 1 via ORAL
  Filled 2017-04-14: qty 1

## 2017-04-14 MED ORDER — MIDAZOLAM HCL 5 MG/5ML IJ SOLN
INTRAMUSCULAR | Status: DC | PRN
Start: 2017-04-14 — End: 2017-04-14
  Administered 2017-04-14: 2 mg via INTRAVENOUS

## 2017-04-14 MED ORDER — PROPOFOL 10 MG/ML IV BOLUS
INTRAVENOUS | Status: AC
  Filled 2017-04-14: qty 20

## 2017-04-14 MED ORDER — ONDANSETRON HCL 4 MG/2ML IJ SOLN
INTRAMUSCULAR | Status: AC
  Filled 2017-04-14: qty 2

## 2017-04-14 MED ORDER — FENTANYL CITRATE (PF) 250 MCG/5ML IJ SOLN
INTRAMUSCULAR | Status: AC
  Filled 2017-04-14: qty 5

## 2017-04-14 MED ORDER — GLYCOPYRROLATE 0.2 MG/ML IV SOSY
PREFILLED_SYRINGE | INTRAVENOUS | Status: DC | PRN
  Administered 2017-04-14: .1 mg via INTRAVENOUS

## 2017-04-14 MED ORDER — IOPAMIDOL (ISOVUE-300) INJECTION 61%
INTRAVENOUS | Status: AC
  Filled 2017-04-14: qty 50

## 2017-04-14 MED ORDER — MIDAZOLAM HCL 2 MG/2ML IJ SOLN
INTRAMUSCULAR | Status: AC
  Filled 2017-04-14: qty 2

## 2017-04-14 MED ORDER — PROPOFOL 10 MG/ML IV BOLUS
INTRAVENOUS | Status: DC | PRN
  Administered 2017-04-14: 130 mg via INTRAVENOUS

## 2017-04-14 MED ORDER — SUGAMMADEX SODIUM 200 MG/2ML IV SOLN
INTRAVENOUS | Status: AC
  Filled 2017-04-14: qty 2

## 2017-04-14 MED ORDER — ONDANSETRON HCL 4 MG/2ML IJ SOLN: 4.0000 mg | Freq: Once | INTRAMUSCULAR | Status: DC | PRN

## 2017-04-14 MED ORDER — FENTANYL CITRATE (PF) 100 MCG/2ML IJ SOLN
INTRAMUSCULAR | Status: DC | PRN
  Administered 2017-04-14 (×3): 50 ug via INTRAVENOUS

## 2017-04-14 MED ORDER — PHENYLEPHRINE 40 MCG/ML (10ML) SYRINGE FOR IV PUSH (FOR BLOOD PRESSURE SUPPORT)
PREFILLED_SYRINGE | INTRAVENOUS | Status: AC
  Filled 2017-04-14: qty 10

## 2017-04-14 MED ORDER — SUGAMMADEX SODIUM 200 MG/2ML IV SOLN
INTRAVENOUS | Status: DC | PRN
  Administered 2017-04-14: 200 mg via INTRAVENOUS

## 2017-04-14 MED ORDER — IOPAMIDOL (ISOVUE-300) INJECTION 61%
INTRAVENOUS | Status: DC | PRN
  Administered 2017-04-14 (×2): 50 mL

## 2017-04-14 MED ORDER — FENTANYL CITRATE (PF) 100 MCG/2ML IJ SOLN: 25.0000 ug | INTRAMUSCULAR | Status: DC | PRN

## 2017-04-14 MED ORDER — OXYCODONE-ACETAMINOPHEN 5-325 MG PO TABS
1.0000 | ORAL_TABLET | ORAL | Status: DC | PRN
  Administered 2017-04-14: 1 via ORAL

## 2017-04-14 MED ORDER — 0.9 % SODIUM CHLORIDE (POUR BTL) OPTIME
TOPICAL | Status: DC | PRN
  Administered 2017-04-14: 1000 mL

## 2017-04-14 MED ORDER — LACTATED RINGERS IV SOLN
INTRAVENOUS | Status: DC
  Administered 2017-04-14 (×3): via INTRAVENOUS

## 2017-04-14 MED ORDER — EPHEDRINE 5 MG/ML INJ
INTRAVENOUS | Status: AC
  Filled 2017-04-14: qty 10

## 2017-04-14 MED ORDER — SUCCINYLCHOLINE CHLORIDE 200 MG/10ML IV SOSY
PREFILLED_SYRINGE | INTRAVENOUS | Status: AC
  Filled 2017-04-14: qty 10

## 2017-04-14 MED ORDER — DEXAMETHASONE SODIUM PHOSPHATE 10 MG/ML IJ SOLN
INTRAMUSCULAR | Status: AC
  Filled 2017-04-14: qty 1

## 2017-04-14 MED ORDER — DEXAMETHASONE SODIUM PHOSPHATE 4 MG/ML IJ SOLN
INTRAMUSCULAR | Status: DC | PRN
  Administered 2017-04-14: 8 mg via INTRAVENOUS

## 2017-04-14 MED ORDER — ROCURONIUM BROMIDE 100 MG/10ML IV SOLN
INTRAVENOUS | Status: DC | PRN
Start: 2017-04-14 — End: 2017-04-14
  Administered 2017-04-14: 45 mg via INTRAVENOUS

## 2017-04-14 MED ORDER — POVIDONE-IODINE 7.5 % EX SOLN
Freq: Once | CUTANEOUS | Status: DC
  Filled 2017-04-14: qty 118

## 2017-04-14 MED ORDER — ONDANSETRON HCL 4 MG/2ML IJ SOLN
INTRAMUSCULAR | Status: DC | PRN
Start: 2017-04-14 — End: 2017-04-14
  Administered 2017-04-14: 4 mg via INTRAVENOUS

## 2017-04-14 MED ORDER — FENTANYL CITRATE (PF) 250 MCG/5ML IJ SOLN
INTRAMUSCULAR | Status: AC
Start: 2017-04-14 — End: ?
  Filled 2017-04-14: qty 5

## 2017-04-14 MED ORDER — CEFAZOLIN SODIUM-DEXTROSE 2-4 GM/100ML-% IV SOLN
2.0000 g | INTRAVENOUS | Status: AC
  Administered 2017-04-14: 2 g via INTRAVENOUS

## 2017-04-14 MED ORDER — LIDOCAINE 2% (20 MG/ML) 5 ML SYRINGE
INTRAMUSCULAR | Status: DC | PRN
  Administered 2017-04-14: 40 mg via INTRAVENOUS

## 2017-04-14 SURGICAL SUPPLY — 44 items
BLADE SURG 15 STRL LF DISP TIS (BLADE) ×1 IMPLANT
BLADE SURG 15 STRL SS (BLADE) ×2
CEMENT BONE KYPHX HV R (Orthopedic Implant) ×2 IMPLANT
COVER MAYO STAND STRL (DRAPES) ×2 IMPLANT
COVER SURGICAL LIGHT HANDLE (MISCELLANEOUS) ×2 IMPLANT
CURETTE EXPRESS SZ2 7MM (INSTRUMENTS) ×1 IMPLANT
CURRETTE EXPRESS SZ2 7MM (INSTRUMENTS) ×2
DRAPE C-ARM 42X72 X-RAY (DRAPES) ×2 IMPLANT
DRAPE HALF SHEET 40X57 (DRAPES) IMPLANT
DRAPE INCISE IOBAN 66X45 STRL (DRAPES) ×2 IMPLANT
DRAPE LAPAROTOMY T 102X78X121 (DRAPES) ×2 IMPLANT
DRAPE SURG 17X23 STRL (DRAPES) ×8 IMPLANT
DURAPREP 26ML APPLICATOR (WOUND CARE) ×2 IMPLANT
GAUZE SPONGE 4X4 12PLY STRL (GAUZE/BANDAGES/DRESSINGS) ×1 IMPLANT
GAUZE SPONGE 4X4 16PLY XRAY LF (GAUZE/BANDAGES/DRESSINGS) ×2 IMPLANT
GLOVE BIO SURGEON STRL SZ7 (GLOVE) ×2 IMPLANT
GLOVE BIO SURGEON STRL SZ8 (GLOVE) ×2 IMPLANT
GLOVE BIOGEL PI IND STRL 7.0 (GLOVE) ×1 IMPLANT
GLOVE BIOGEL PI IND STRL 8 (GLOVE) ×1 IMPLANT
GLOVE BIOGEL PI INDICATOR 7.0 (GLOVE) ×1
GLOVE BIOGEL PI INDICATOR 8 (GLOVE) ×1
GOWN STRL REUS W/ TWL LRG LVL3 (GOWN DISPOSABLE) ×2 IMPLANT
GOWN STRL REUS W/ TWL XL LVL3 (GOWN DISPOSABLE) ×1 IMPLANT
GOWN STRL REUS W/TWL LRG LVL3 (GOWN DISPOSABLE) ×4
GOWN STRL REUS W/TWL XL LVL3 (GOWN DISPOSABLE) ×2
KIT BASIN OR (CUSTOM PROCEDURE TRAY) ×2 IMPLANT
KIT ROOM TURNOVER OR (KITS) ×2 IMPLANT
NDL HYPO 25X1 1.5 SAFETY (NEEDLE) IMPLANT
NDL SPNL 18GX3.5 QUINCKE PK (NEEDLE) ×2 IMPLANT
NEEDLE 22X1 1/2 (OR ONLY) (NEEDLE) IMPLANT
NEEDLE HYPO 25X1 1.5 SAFETY (NEEDLE) IMPLANT
NEEDLE SPNL 18GX3.5 QUINCKE PK (NEEDLE) ×4 IMPLANT
NS IRRIG 1000ML POUR BTL (IV SOLUTION) ×2 IMPLANT
PACK UNIVERSAL I (CUSTOM PROCEDURE TRAY) ×2 IMPLANT
PAD ARMBOARD 7.5X6 YLW CONV (MISCELLANEOUS) ×4 IMPLANT
POSITIONER HEAD PRONE TRACH (MISCELLANEOUS) ×2 IMPLANT
SUT MNCRL AB 4-0 PS2 18 (SUTURE) ×2 IMPLANT
SYR BULB IRRIGATION 50ML (SYRINGE) ×2 IMPLANT
SYR CONTROL 10ML LL (SYRINGE) ×2 IMPLANT
TOWEL OR 17X24 6PK STRL BLUE (TOWEL DISPOSABLE) ×2 IMPLANT
TOWEL OR 17X26 10 PK STRL BLUE (TOWEL DISPOSABLE) ×2 IMPLANT
TRAY KYPHOPAK 15/2 EXPRESS (KITS) ×2 IMPLANT
TRAY KYPHOPAK 15/3 ONESTEP 1ST (MISCELLANEOUS) IMPLANT
TRAY KYPHOPAK 20/3 ONESTEP 1ST (MISCELLANEOUS) IMPLANT

## 2017-04-14 NOTE — H&P (Signed)
PREOPERATIVE H&P  Chief Complaint: Mid back pain  HPI: Tracey Rowe is a 61 y.o. female who presents with ongoing pain in the mid back  MRI reveals compression fractures at T7 and T8  Patient has failed multiple forms of conservative care and continues to have pain (see office notes for additional details regarding the patient's full course of treatment)  Past Medical History:  Diagnosis Date  . ADD (attention deficit disorder)   . Anxiety    hx  . Arthritis   . Chronic cough   . Depression    hx  . Family history of adverse reaction to anesthesia    daughter vomiting  . Fibromyalgia   . GERD (gastroesophageal reflux disease)   . Headache    pain- affects vision  . Heart murmur    ? yrs ago- not since  . Hyperlipidemia   . Hypothyroidism   . Irritable bowel syndrome   . Osteoporosis   . Thyroid disease   . Vitamin D deficiency    Past Surgical History:  Procedure Laterality Date  . abdominopexy    . FACELIFT    . FOOT SURGERY Right    Social History   Socioeconomic History  . Marital status: Divorced    Spouse name: None  . Number of children: None  . Years of education: None  . Highest education level: None  Social Needs  . Financial resource strain: None  . Food insecurity - worry: None  . Food insecurity - inability: None  . Transportation needs - medical: None  . Transportation needs - non-medical: None  Occupational History  . None  Tobacco Use  . Smoking status: Never Smoker  . Smokeless tobacco: Never Used  Substance and Sexual Activity  . Alcohol use: Yes    Comment: 3- 4 beers a week  . Drug use: No  . Sexual activity: None  Other Topics Concern  . None  Social History Narrative  . None   Family History  Problem Relation Age of Onset  . Hyperlipidemia Mother   . Hypertension Father   . Hyperlipidemia Paternal Grandfather   . Heart attack Paternal Grandfather    Allergies  Allergen Reactions  . Morphine Itching    Prior to Admission medications   Medication Sig Start Date End Date Taking? Authorizing Provider  alendronate (FOSAMAX) 70 MG tablet Take 70 mg once a week by mouth. Take with a full glass of water on an empty stomach.   Yes [provider]  fluticasone (FLONASE) 50 MCG/ACT nasal spray Place 2 sprays 2 (two) times daily into both nostrils.   Yes [provider]  ipratropium (ATROVENT) 0.03 % nasal spray Place 2 sprays 2 (two) times daily into both nostrils.   Yes [provider]  Calcium Carb-Cholecalciferol (CALCIUM 600 + D PO) Take 1 tablet by mouth daily.    [provider]  cholecalciferol (VITAMIN D) 1000 units tablet Take 1,000 Units by mouth daily.    [provider]  diphenhydrAMINE (BENADRYL) 50 MG capsule Take 50 mg by mouth at bedtime as needed (for sleep.).    [provider]  DiphenhydrAMINE HCl (ZZZQUIL) 50 MG/30ML LIQD Take 50 mg by mouth at bedtime as needed (for sleep.).    [provider]  ibuprofen (ADVIL,MOTRIN) 200 MG tablet Take 200-400 mg by mouth every 8 (eight) hours as needed (for pain.).    [provider]  Lactobacillus (PROBIOTIC ACIDOPHILUS PO) Take 1 capsule by mouth  daily.    [provider]  levothyroxine (SYNTHROID, LEVOTHROID) 88 MCG tablet Take 88 mcg by mouth at bedtime. 01/19/17   [provider]  lidocaine (LIDODERM) 5 % Place 1 patch onto the skin daily as needed (for pain.). Remove & Discard patch within 12 hours or as directed by MD    [provider]  Magnesium 400 MG TABS Take 400 mg by mouth daily.    [provider]  MELATONIN PO Take 1 tablet by mouth at bedtime as needed (for sleep.).    [provider]  metaxalone (SKELAXIN) 800 MG tablet Take 800 mg by mouth 3 (three) times daily as needed for muscle spasms (for back pain/spasms).    [provider]  Misc Natural Products (PYCNOGENOL COMPLEX PO) Take 2 capsules by mouth  daily.    [provider]  omeprazole (PRILOSEC) 20 MG capsule Take 20 mg by mouth daily.    [provider]  Plant Sterol Stanol-Pantethine (CHOLEST OFF COMPLETE PO) Take 1 tablet by mouth 3 (three) times daily before meals.    [provider]  Prenatal Vit-Fe Fumarate-FA (PRENATAL MULTIVITAMIN) TABS tablet Take 1 tablet by mouth daily.    [provider]  topiramate (TOPAMAX) 50 MG tablet Take 50 mg by mouth at bedtime.     [provider]  traMADol (ULTRAM) 50 MG tablet Take 50 mg by mouth every 6 (six) hours as needed (for pain.).    [provider]  triamcinolone ointment (KENALOG) 0.1 % Apply 1 application topically 2 (two) times daily as needed (for dry skin on ears.).    [provider]  Turmeric 500 MG TABS Take 500 mg by mouth daily.    [provider]     All other systems have been reviewed and were otherwise negative with the exception of those mentioned in the HPI and as above.  Physical Exam: There were no vitals filed for this visit.  General: Alert, no acute distress Cardiovascular: No pedal edema Respiratory: No cyanosis, no use of accessory musculature Skin: No lesions in the area of chief complaint Neurologic: Sensation intact distally Psychiatric: Patient is competent for consent with normal mood and affect Lymphatic: No axillary or cervical lymphadenopathy  MUSCULOSKELETAL: + TTP mid back  Assessment/Plan: Thoracic 7, 8 compression fracture Plan for Procedure(s): THORACIC 7, 8 KYPHOPLASTY   Emilee HeroUMONSKI,Gracianna Vink LEONARD, MD 04/14/2017 7:16 AM

## 2017-04-14 NOTE — Anesthesia Postprocedure Evaluation (Signed)
Anesthesia Post Note  Patient: Tracey Rowe  Procedure(s) Performed: THORACIC 7, 8 KYPHOPLASTY (N/A )     Patient location during evaluation: PACU Anesthesia Type: General Level of consciousness: awake, awake and alert and oriented Pain management: pain level controlled Vital Signs Assessment: post-procedure vital signs reviewed and stable Respiratory status: spontaneous breathing, nonlabored ventilation and respiratory function stable Cardiovascular status: blood pressure returned to baseline Anesthetic complications: no    Last Vitals:  Vitals:   04/14/17 1440 04/14/17 1449  BP: (!) 121/106 (!) 125/98  Pulse: 71   Resp: 19   Temp:    SpO2: 100% 98%    Last Pain:  Vitals:   04/14/17 1425  TempSrc:   PainSc: 8                  Chadd Tollison COKER

## 2017-04-14 NOTE — Transfer of Care (Signed)
Immediate Anesthesia Transfer of Care Note  Patient: Tracey Rowe  Procedure(s) Performed: THORACIC 7, 8 KYPHOPLASTY (N/A )  Patient Location: PACU  Anesthesia Type:General  Level of Consciousness: drowsy and patient cooperative  Airway & Oxygen Therapy: Patient Spontanous Breathing and Patient connected to nasal cannula oxygen  Post-op Assessment: Report given to RN and Post -op Vital signs reviewed and stable  Post vital signs: Reviewed and stable  Last Vitals:  Vitals:   04/14/17 0936  BP: (!) 156/80  Pulse: 77  Resp: 18  Temp: 36.7 C  SpO2: 100%    Last Pain:  Vitals:   04/14/17 0936  TempSrc: Oral      Patients Stated Pain Goal: 3 (04/14/17 0950)  Complications: No apparent anesthesia complications

## 2017-04-14 NOTE — Anesthesia Procedure Notes (Signed)
Procedure Name: Intubation Date/Time: 04/14/2017 12:07 PM Performed by: Julian ReilWelty, Betzalel Umbarger F, CRNA Pre-anesthesia Checklist: Patient identified, Emergency Drugs available, Suction available, Patient being monitored and Timeout performed Patient Re-evaluated:Patient Re-evaluated prior to induction Oxygen Delivery Method: Circle system utilized Preoxygenation: Pre-oxygenation with 100% oxygen Induction Type: IV induction Ventilation: Mask ventilation without difficulty Laryngoscope Size: Miller and 3 Grade View: Grade I Tube type: Oral Tube size: 7.0 mm Number of attempts: 1 Airway Equipment and Method: Stylet Placement Confirmation: ETT inserted through vocal cords under direct vision,  positive ETCO2 and breath sounds checked- equal and bilateral Secured at: 22 cm Tube secured with: Tape Dental Injury: Teeth and Oropharynx as per pre-operative assessment  Comments: Noted broken lower 2nd molar, left side, cosmetic filled left upper incisor pre-op, none loose per patient report.  4x4s bite block used.

## 2017-04-14 NOTE — Anesthesia Preprocedure Evaluation (Signed)
Anesthesia Evaluation  Patient identified by MRN, date of birth, ID band Patient awake    Reviewed: Allergy & Precautions, NPO status , Patient's Chart, lab work & pertinent test results  Airway Mallampati: II  TM Distance: >3 FB Neck ROM: Full    Dental  (+) Teeth Intact, Chipped,    Pulmonary    breath sounds clear to auscultation       Cardiovascular  Rhythm:Regular Rate:Normal     Neuro/Psych    GI/Hepatic   Endo/Other    Renal/GU      Musculoskeletal   Abdominal   Peds  Hematology   Anesthesia Other Findings   Reproductive/Obstetrics                             Anesthesia Physical Anesthesia Plan  ASA: II  Anesthesia Plan: General   Post-op Pain Management:    Induction: Intravenous  PONV Risk Score and Plan: Ondansetron and Dexamethasone  Airway Management Planned: Oral ETT  Additional Equipment:   Intra-op Plan:   Post-operative Plan: Extubation in OR  Informed Consent: I have reviewed the patients History and Physical, chart, labs and discussed the procedure including the risks, benefits and alternatives for the proposed anesthesia with the patient or authorized representative who has indicated his/her understanding and acceptance.   Dental advisory given  Plan Discussed with: CRNA and Anesthesiologist  Anesthesia Plan Comments:         Anesthesia Quick Evaluation

## 2017-04-15 ENCOUNTER — Encounter (HOSPITAL_COMMUNITY): Payer: Self-pay | Admitting: Orthopedic Surgery

## 2017-04-15 NOTE — Op Note (Signed)
NAME:  Tracey Rowe, Tracey Rowe                  ACCOUNT NO.:  MEDICAL RECORD NO.:  [id number]  PHYSICIAN:  Estill BambergMark Nadene Witherspoon, MD           DATE OF BIRTH:  DATE OF PROCEDURE:  04/14/2017                              OPERATIVE REPORT   PREOPERATIVE DIAGNOSIS:  Compression fracture, T7, T8.  POSTOPERATIVE DIAGNOSIS:  Compression fracture, T7, T8.  PROCEDURE:  Kyphoplasty, T7, T8.  SURGEON:  Estill BambergMark Sylina Henion, MD.  ASSISTANJason Coop:  Kayla McKenzie, PA-C.  ANESTHESIA:  General endotracheal anesthesia.  COMPLICATIONS:  None.  DISPOSITION:  Stable.  ESTIMATED BLOOD LOSS:  Minimal.  INDICATIONS FOR SURGERY:  Briefly, Tracey Rowe is a 61 year old female who did present to me with some pain in her mid back.  At the time of my evaluation with the patient on January 29, 2017, she did describe about a 3635-month history of pain.  The pain did radiate into the right and left sides of her chest as well, in the region of her ribs.  Therapy was not beneficial to her.  At this particular visit, I did review an MRI with her, which I did order at the previous visit, which was notable for compression fractures involving T7 and T8.  The patient did continue to have ongoing rather debilitating pain, and therefore we did discuss proceeding with the procedure noted above.  The patient was fully aware of the risks and limitations of surgery and did elect to proceed.  OPERATIVE DETAILS:  On April 14, 2017, the patient was brought to Surgery and general endotracheal anesthesia was administered.  The patient was placed prone on a well-padded flat Jackson bed.  Gels were placed under the patient's chest and hips.  Antibiotics were given.  A time-out procedure was performed.  I then brought the lateral fluoroscopy into the field.  The back was prepped and draped and small stab incisions were made overlying the T7 and T8 pedicles bilaterally. AP fluoroscopy was brought into the field prior to this.  I then cannulated the  pedicles bilaterally at T7 and T8 in the usual manner.  I did liberally use AP and lateral fluoroscopy while advancing the Jamshidi, to ensure that they were in satisfactory position, and to ensure that no medial breach of the pedicle was encountered.  At this point, I then drilled through the cannulas and I did insert kyphoplasty balloons bilaterally at T7 and T8.  Each balloon inflated with approximately 2 mL of contrast.  The balloons were then deflated and removed, and at this point, cement was introduced into the bilateral pedicles first at T8, then at T7.  Excellent interdigitation of cement was noted.  The cement did interdigitate well throughout the vertebral bodies, and did extend to the posterior cortex, and did also extend both superiorly and inferiorly.  There was no abnormal extravasation of cement into the spinal canal.  Very small, very minimal take up of a vascular structure was noted laterally on the left side, but this certainly was not significant, and was not certainly felt to have any clinical sequelae, whatsoever.  The cement was allowed to harden.  The cannulas were then removed.  The wounds were irrigated and closed with 4- 0 Monocryl.  Bacitracin and a sterile dressing were applied.  All instrument counts were correct  at the termination of the procedure.  The patient was then awoken from general endotracheal anesthesia and transferred to recovery in stable condition.     Estill BambergMark Qusay Villada, MD     MD/MEDQ  D:  04/14/2017  T:  04/14/2017  Job:  [id number]  cc:   Dario Guardianandace T. Smith, M.D.

## 2017-05-13 ENCOUNTER — Ambulatory Visit
Admission: RE | Admit: 2017-05-13 | Discharge: 2017-05-13 | Disposition: A | Discharge status: Home / Self Care | Payer: BLUE CROSS/BLUE SHIELD | Source: Ambulatory Visit | Attending: Obstetrics & Gynecology | Admitting: Obstetrics & Gynecology

## 2017-05-13 DIAGNOSIS — Z1231 Encounter for screening mammogram for malignant neoplasm of breast: Secondary | ICD-10-CM

## 2017-05-18 ENCOUNTER — Other Ambulatory Visit: Payer: Self-pay | Admitting: Specialist

## 2017-05-18 DIAGNOSIS — G43109 Migraine with aura, not intractable, without status migrainosus: Secondary | ICD-10-CM

## 2017-05-19 ENCOUNTER — Other Ambulatory Visit: Payer: Self-pay | Admitting: Specialist

## 2017-05-19 DIAGNOSIS — M546 Pain in thoracic spine: Secondary | ICD-10-CM

## 2017-05-23 ENCOUNTER — Ambulatory Visit
Admission: RE | Admit: 2017-05-23 | Discharge: 2017-05-23 | Disposition: A | Discharge status: Home / Self Care | Payer: BLUE CROSS/BLUE SHIELD | Source: Ambulatory Visit | Attending: Specialist | Admitting: Specialist

## 2017-05-23 DIAGNOSIS — G43109 Migraine with aura, not intractable, without status migrainosus: Secondary | ICD-10-CM

## 2017-05-23 DIAGNOSIS — M546 Pain in thoracic spine: Secondary | ICD-10-CM

## 2017-07-29 ENCOUNTER — Other Ambulatory Visit: Payer: Self-pay | Admitting: Gastroenterology

## 2017-07-29 DIAGNOSIS — R1033 Periumbilical pain: Secondary | ICD-10-CM

## 2017-08-20 ENCOUNTER — Ambulatory Visit
Admission: RE | Admit: 2017-08-20 | Discharge: 2017-08-20 | Disposition: A | Discharge status: Home / Self Care | Payer: BLUE CROSS/BLUE SHIELD | Source: Ambulatory Visit | Attending: Gastroenterology | Admitting: Gastroenterology

## 2017-08-20 DIAGNOSIS — R1033 Periumbilical pain: Secondary | ICD-10-CM

## 2017-08-20 MED ORDER — IOPAMIDOL (ISOVUE-300) INJECTION 61%
100.0000 mL | Freq: Once | INTRAVENOUS | Status: AC | PRN
  Administered 2017-08-20: 100 mL via INTRAVENOUS

## 2017-11-14 IMAGING — RF DG C-ARM 61-120 MIN
1 series · 1 of 1 positions shown · non-contrast
Comparison: 01/24/2017

CLINICAL DATA: Thoracic kyphoplasty

EXAM:
DG C-ARM 61-120 MIN; THORACIC SPINE 2 VIEWS

[Series 1: run · 1 of 1 slices shown]
[im 1/1]
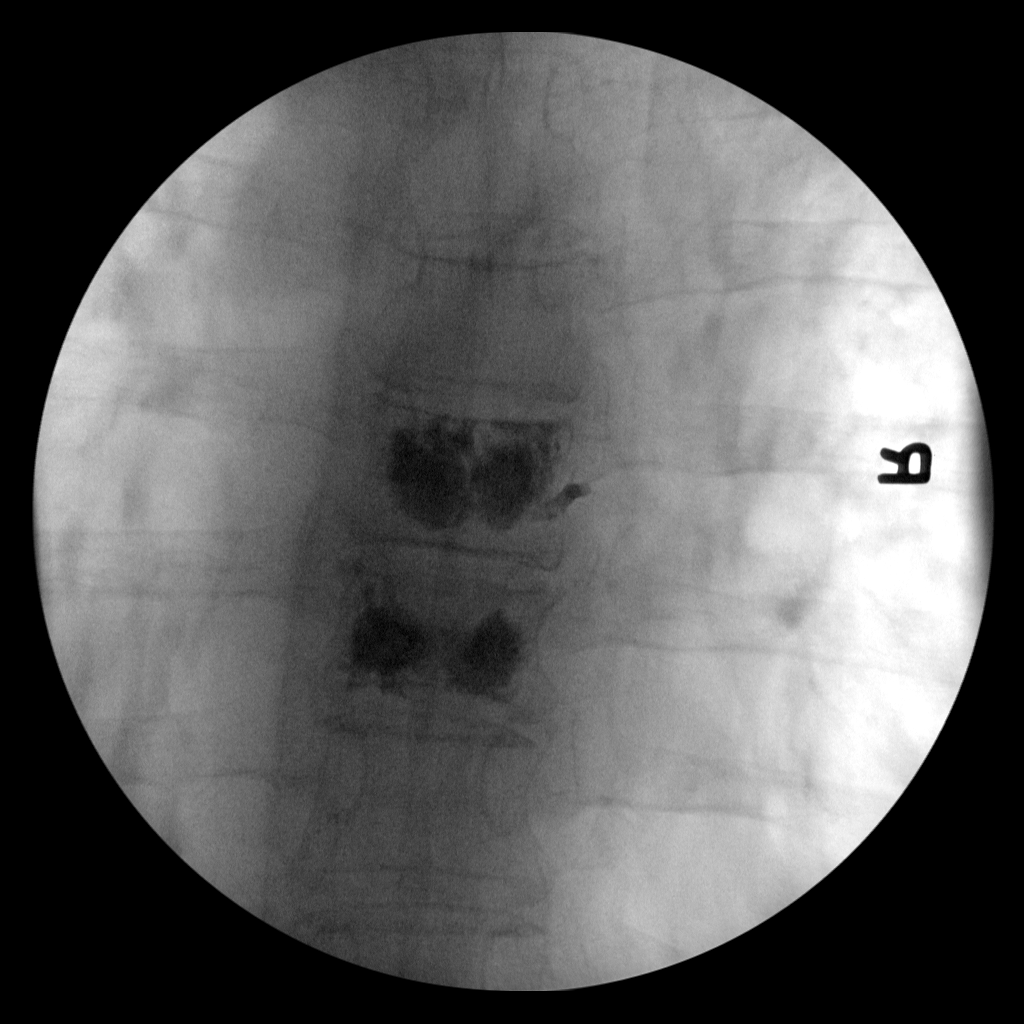

[1 of 1 positions shown; findings below may reference images not displayed]

FLUOROSCOPY TIME:  Fluoroscopy Time:  4 minutes 13 seconds

Radiation Exposure Index (if provided by the fluoroscopic device):
Not available

Number of Acquired Spot Images: 2
FINDINGS: Contrast laden cement is noted in 2 midthoracic vertebral bodies
listed as T7 and T8. Filling of a right paravertebral vein is noted
at T7. No significant extravasation of contrast is noted
posteriorly. No other focal abnormality is noted.
IMPRESSION: Kyphoplasty at T7 and T8.
